# Patient Record
Sex: Female | Born: 2000 | Race: Black or African American | Hispanic: No | Marital: Single | Smoking: Never smoker
Health system: Southern US, Community
[De-identification: ages and names within clinical notes are randomized; demographics above are authoritative.]

---

## 2019-10-10 ENCOUNTER — Emergency Department (HOSPITAL_COMMUNITY): Payer: Medicaid Other

## 2019-10-10 ENCOUNTER — Emergency Department (HOSPITAL_COMMUNITY)
Admission: EM | Admit: 2019-10-10 | Discharge: 2019-10-10 | Disposition: A | Discharge status: Home / Self Care | Payer: Medicaid Other | Attending: Emergency Medicine | Admitting: Emergency Medicine

## 2019-10-10 ENCOUNTER — Encounter (HOSPITAL_COMMUNITY): Payer: Self-pay

## 2019-10-10 ENCOUNTER — Other Ambulatory Visit: Payer: Self-pay

## 2019-10-10 DIAGNOSIS — M25532 Pain in left wrist: Secondary | ICD-10-CM | POA: Insufficient documentation

## 2019-10-10 NOTE — ED Notes (Signed)
Ace wrap applied to left wrist. Discharge instructions reviewed with patient.

## 2019-10-10 NOTE — ED Triage Notes (Addendum)
Patient c/o left wrist pain X1 day.  8/10 pain with activity   Denies injury or falling. No obvious deformities or swelling noted by this RN.  Patient reports she works at [employer].   A/Ox4 Ambulatory in triage.

## 2019-10-10 NOTE — ED Provider Notes (Signed)
[facility]-EMERGENCY DEPT Provider Note   CSN: [id number] Arrival date & time: 10/10/19  1031     History Chief Complaint  Patient presents with  . Wrist Pain    Donna Wilkinson is a 19 y.o. female without significant past medical history, presenting to the ED with complaint of left wrist pain that began yesterday.  Patient states she has no pain at rest though does have pain to the volar aspect when she flexes her wrist.  No known injury.  No swelling, numbness or tingling to the hand.  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  No interventions tried prior to arrival for symptom management.  No previous injury to left wrist.  The history is provided by the patient.       History reviewed. No pertinent past medical history.  There are no problems to display for this patient.   History reviewed. No pertinent surgical history.   OB History   No obstetric history on file.     History reviewed. No pertinent family history.  Social History   Tobacco Use  . Smoking status: Never Smoker  . Smokeless tobacco: Never Used  Substance Use Topics  . Alcohol use: Not on file  . Drug use: Not on file    Home Medications Prior to Admission medications   Not on File    Allergies    Patient has no allergy information on record.  Review of Systems   Review of Systems  Musculoskeletal: Positive for arthralgias.  Skin: Negative for color change and wound.  Neurological: Negative for numbness.    Physical Exam Updated Vital Signs BP (!) 148/105 (BP Location: Right Arm)   Pulse 99   Temp 99.1 F (37.3 C) (Oral)   Resp 14   LMP 10/06/2019   SpO2 100%   Physical Exam Vitals and nursing note reviewed.  Constitutional:      General: She is not in acute distress.    Appearance: She is well-developed.  HENT:     Head: Normocephalic and atraumatic.  Eyes:      Conjunctiva/sclera: Conjunctivae normal.  Cardiovascular:     Rate and Rhythm: Normal rate.  Pulmonary:     Effort: Pulmonary effort is normal.  Musculoskeletal:     Comments: Volar aspect of left wrist with some TTP. No swelling/redness/warmth/bruising/wounds. Pain with passive extension and active flexion of the wrist. Normal ROM. Normal grip strength and sensation  Neurological:     Mental Status: She is alert.  Psychiatric:        Mood and Affect: Mood normal.        Behavior: Behavior normal.     ED Results / Procedures / Treatments   Labs (all labs ordered are listed, but only abnormal results are displayed) Labs Reviewed - No data to display  EKG None  Radiology DG Wrist Complete Left  Result Date: 10/10/2019 CLINICAL DATA:  Left wrist pain for 2 days.  No known injury. EXAM: LEFT WRIST - COMPLETE 3+ VIEW COMPARISON:  None. FINDINGS: There is no evidence of fracture or dislocation. There is no evidence of arthropathy or other focal bone abnormality. Soft tissues are unremarkable. IMPRESSION: Normal exam. Electronically Signed   By: Drusilla Kanner M.D.   On: 10/10/2019 11:47    Procedures Procedures (including critical care time)  Medications Ordered in ED Medications - No data to display  ED Course  I  have reviewed the triage vital signs and the nursing notes.  Pertinent labs & imaging results that were available during my care of the patient were reviewed by me and considered in my medical decision making (see chart for details).    MDM Rules/Calculators/A&P                      Pt with left wrist pain with movement since yesterday. No known injury. Exam with some TTP and pain with ROM. NV intact. No redness/warmth/swelling to suggest septic arthritis. Xray neg. Likely strain vs tendonitis. Will treat symptomatically with ace wrap for compression and support. Recommend ice, NSAIDs, rest. PCP follow up as needed. Safe for discharge.  Discussed results, findings,  treatment and follow up. Patient advised of return precautions. Patient verbalized understanding and agreed with plan.  Final Clinical Impression(s) / ED Diagnoses Final diagnoses:  Left wrist pain    Rx / DC Orders ED Discharge Orders    None       Verdie Wilms, [place] N, PA-C 10/10/19 1211    Hayden Rasmussen, MD 10/10/19 [phone]

## 2019-10-10 NOTE — Discharge Instructions (Addendum)
Your x-ray is normal. Please read instructions below. Apply ice to your wrist for 20 minutes at a time. You can take ibuprofen every 6 hours as needed for pain. Schedule an appointment with your primary care if symptoms persist. Return to the ER for new or concerning symptoms.

## 2021-05-13 IMAGING — CR DG WRIST COMPLETE 3+V*L*
4 series · 4 of 4 positions shown · non-contrast
Comparison: None.

CLINICAL DATA: Left wrist pain for 2 days.  No known injury.

EXAM:
LEFT WRIST - COMPLETE 3+ VIEW

[x wrist pa left]
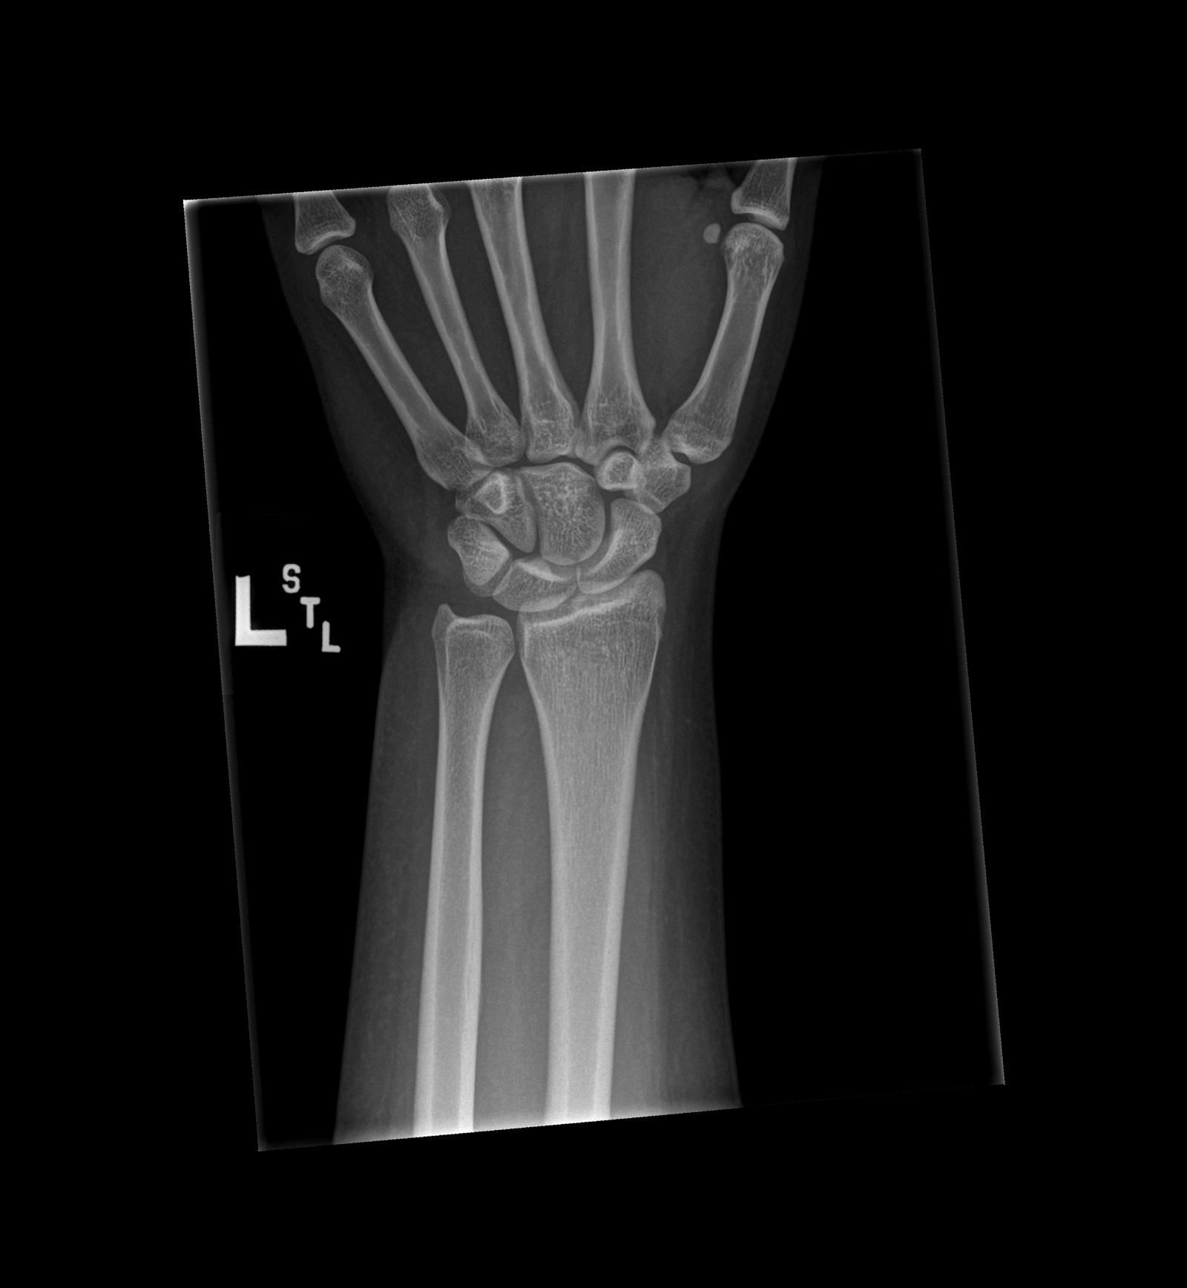

[x wrist obl left]
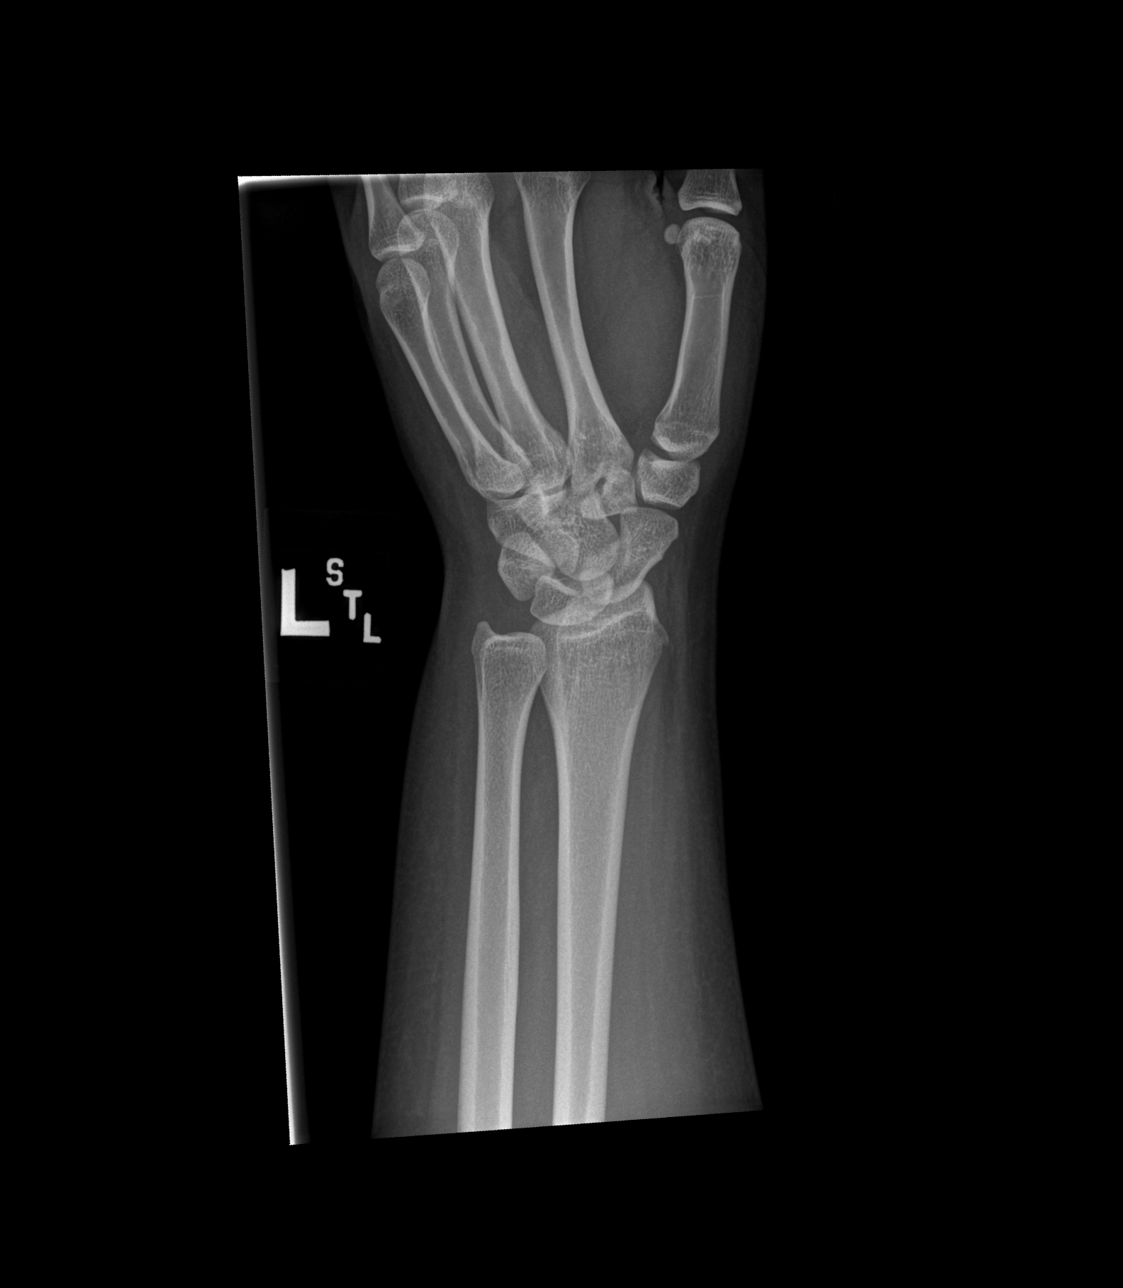

[x wrist lat left]
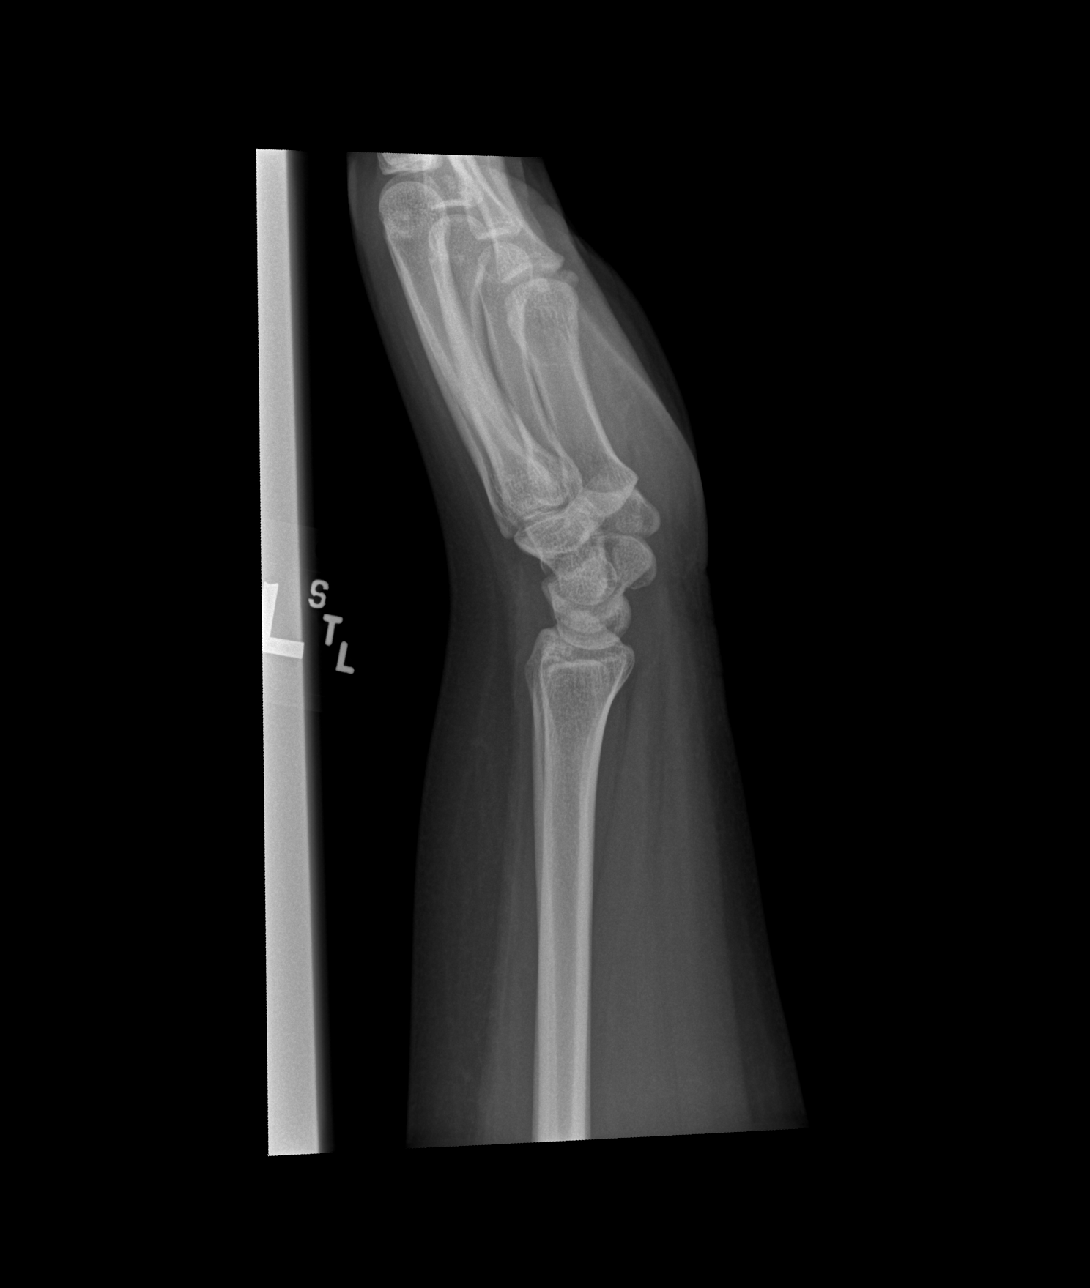

[x wrist navicular view left]
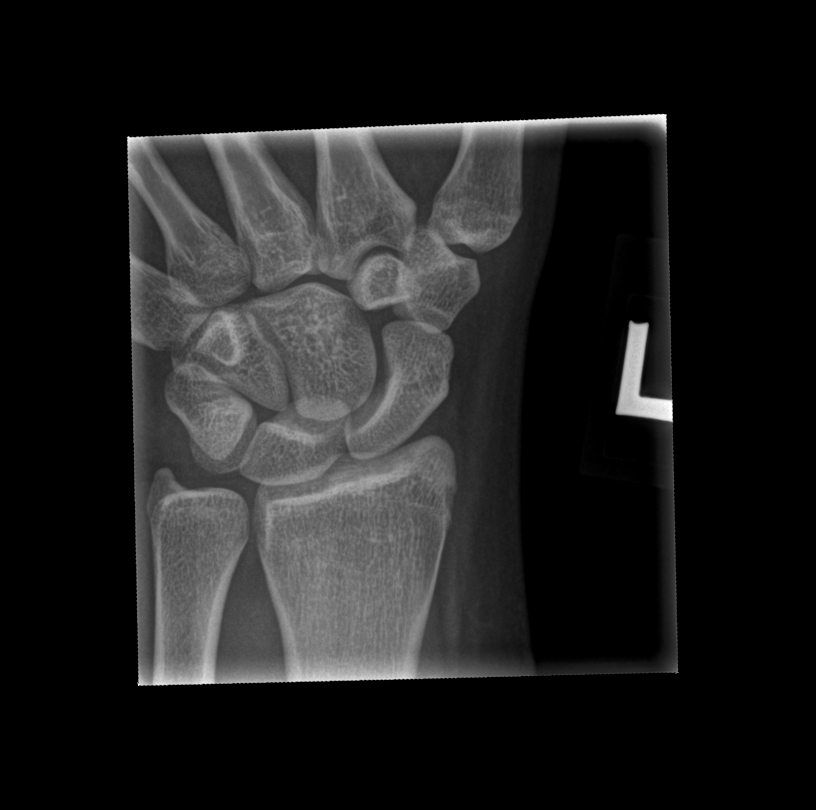

[4 of 4 positions shown; findings below may reference images not displayed]

FINDINGS: There is no evidence of fracture or dislocation. There is no
evidence of arthropathy or other focal bone abnormality. Soft
tissues are unremarkable.
IMPRESSION: Normal exam.
